# Patient Record
Sex: Male | Born: 1985 | Race: White | Hispanic: No | Marital: Single | Smoking: Current every day smoker
Health system: Southern US, Community
[De-identification: ages and names within clinical notes are randomized; demographics above are authoritative.]

## PROBLEM LIST (undated history)

## (undated) DIAGNOSIS — H1789 Other corneal scars and opacities: Secondary | ICD-10-CM

---

## 2005-04-27 ENCOUNTER — Emergency Department (HOSPITAL_COMMUNITY): Admission: EM | Admit: 2005-04-27 | Discharge: 2005-04-27 | Payer: Self-pay | Admitting: Emergency Medicine

## 2005-04-28 IMAGING — CR DG SHOULDER 2+V*L*
3 series · 3 of 3 positions shown · non-contrast
Comparison: none

CLINICAL DATA: Injury. 
 LEFT SHOULDER ? 3 VIEW ([DATE] HOURS):
 There is no evidence of fracture or dislocation.  There is no evidence of arthropathy or other focal bone abnormality.  Soft tissues are unremarkable.

[t shoulder ap internal left]
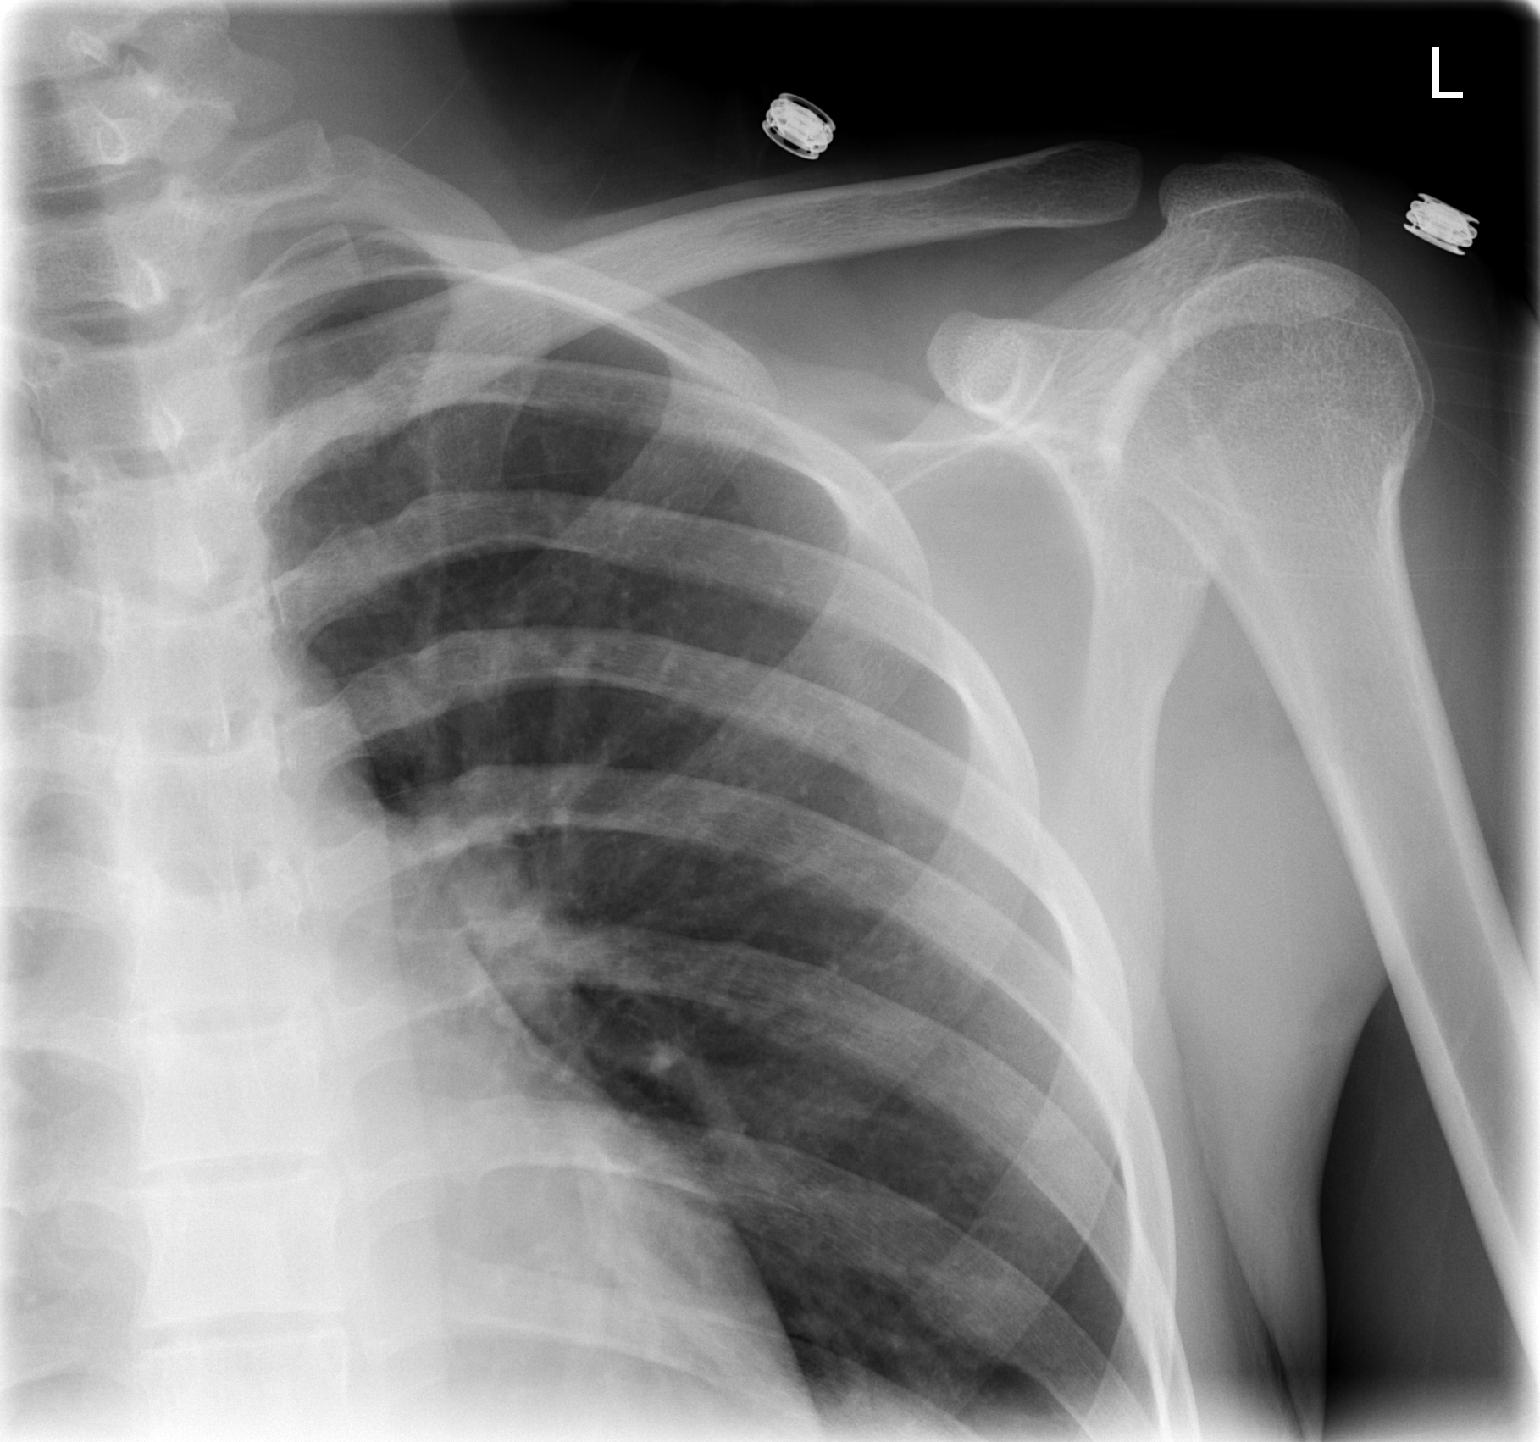

[t shoulder ap external left]
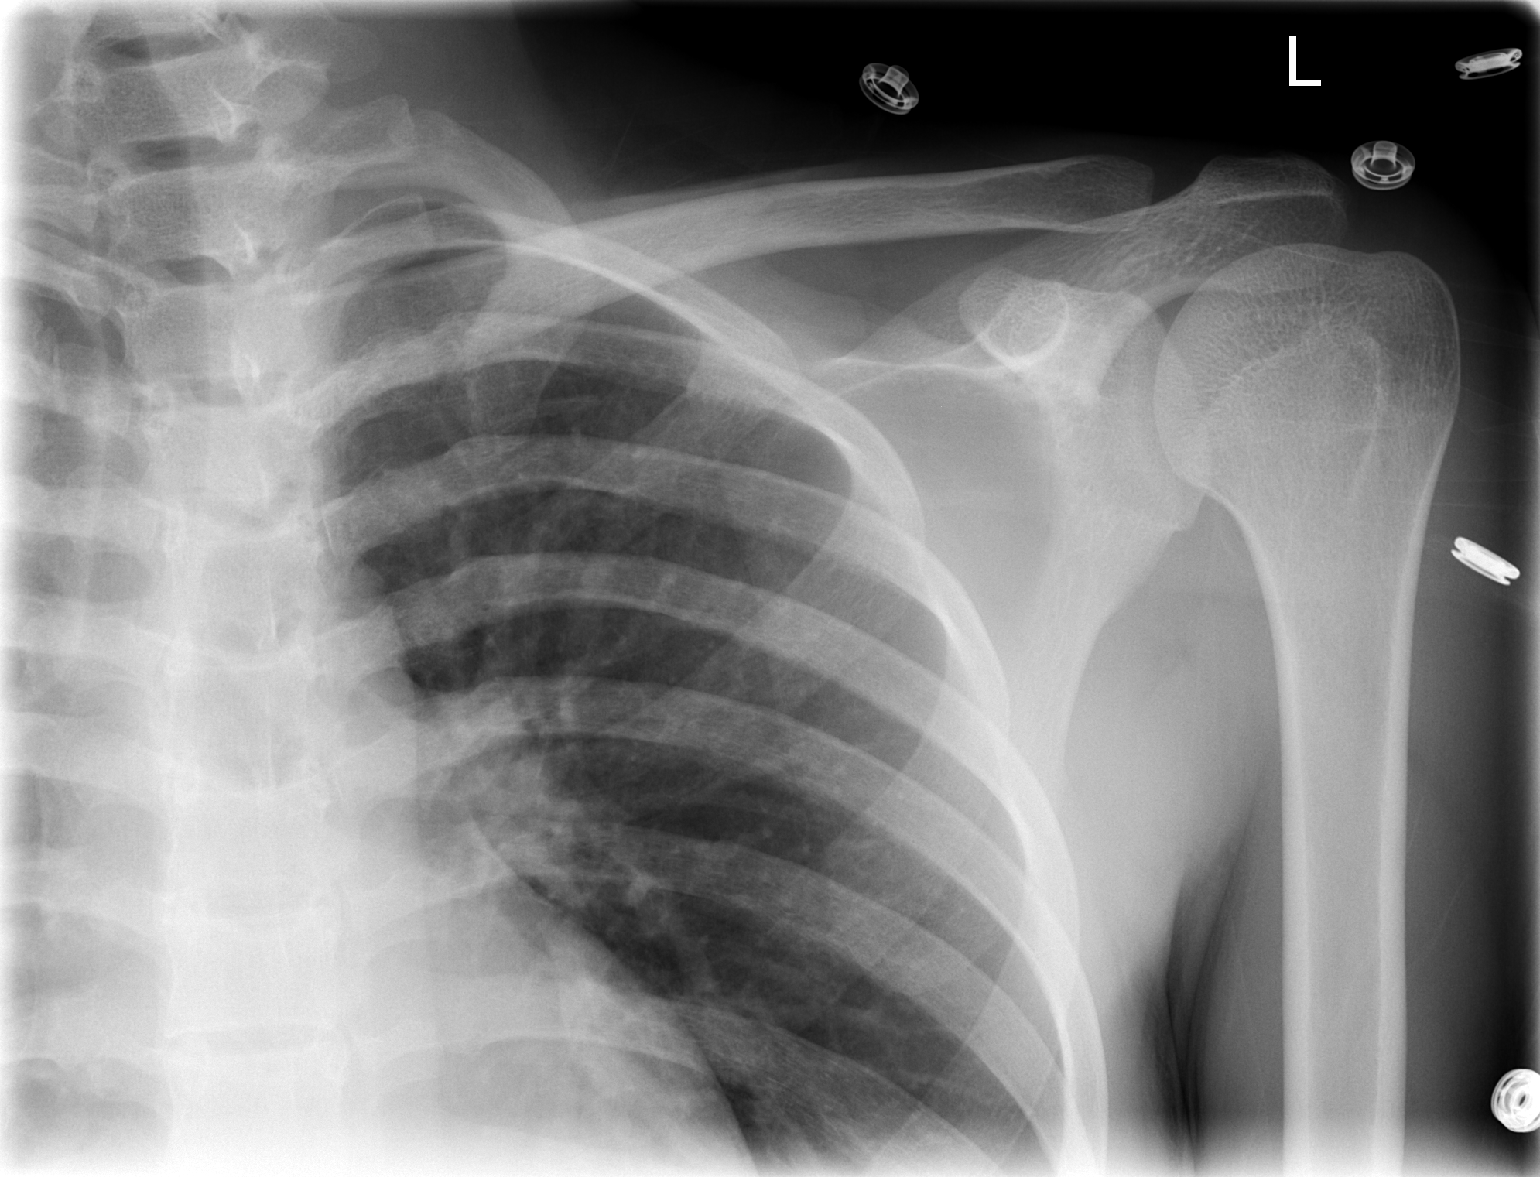

[t shoulder y view left]
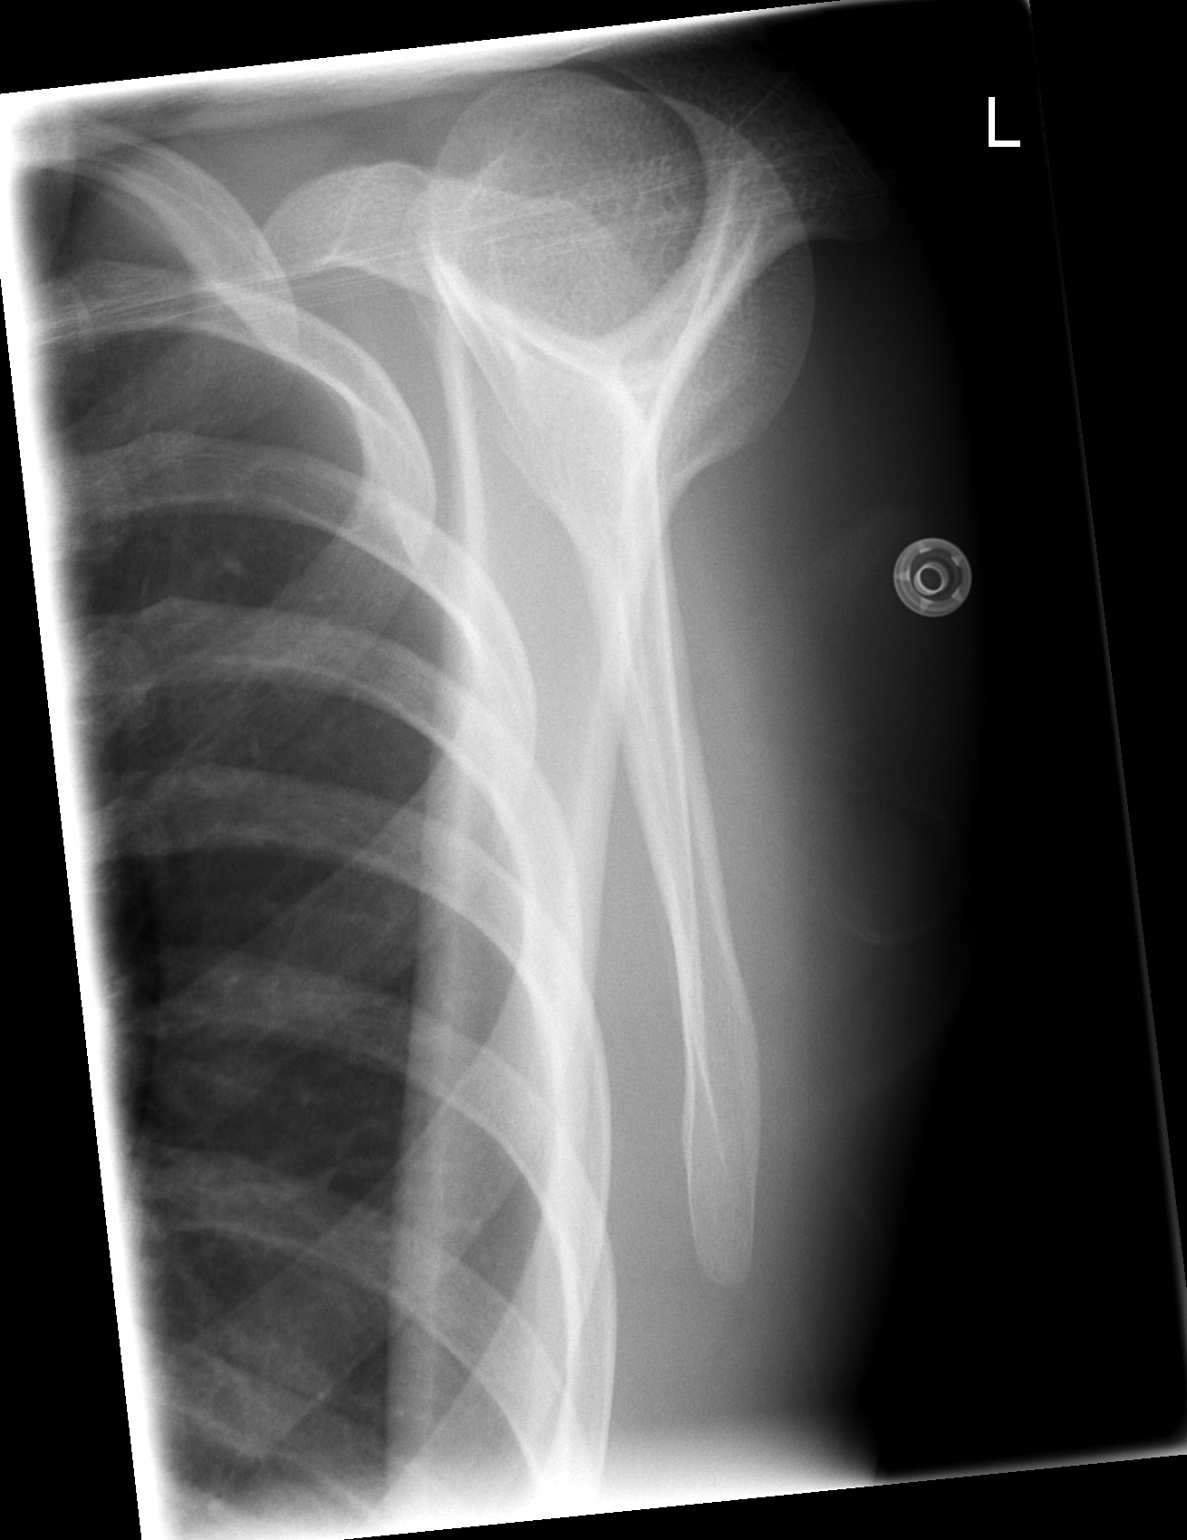

[3 of 3 positions shown; findings below may reference images not displayed]

IMPRESSION: Negative.

## 2006-08-10 ENCOUNTER — Emergency Department (HOSPITAL_COMMUNITY): Admission: EM | Admit: 2006-08-10 | Discharge: 2006-08-10 | Payer: Self-pay | Admitting: Emergency Medicine

## 2006-10-10 ENCOUNTER — Emergency Department (HOSPITAL_COMMUNITY): Admission: EM | Admit: 2006-10-10 | Discharge: 2006-10-10 | Payer: Self-pay | Admitting: Emergency Medicine

## 2014-03-13 ENCOUNTER — Encounter (HOSPITAL_COMMUNITY): Payer: Self-pay | Admitting: Emergency Medicine

## 2014-03-13 ENCOUNTER — Emergency Department (HOSPITAL_COMMUNITY)
Admission: EM | Admit: 2014-03-13 | Discharge: 2014-03-13 | Disposition: A | Discharge status: Home / Self Care | Payer: Self-pay | Attending: Emergency Medicine | Admitting: Emergency Medicine

## 2014-03-13 DIAGNOSIS — R42 Dizziness and giddiness: Secondary | ICD-10-CM | POA: Insufficient documentation

## 2014-03-13 DIAGNOSIS — R112 Nausea with vomiting, unspecified: Secondary | ICD-10-CM | POA: Insufficient documentation

## 2014-03-13 DIAGNOSIS — J36 Peritonsillar abscess: Secondary | ICD-10-CM | POA: Insufficient documentation

## 2014-03-13 DIAGNOSIS — F17200 Nicotine dependence, unspecified, uncomplicated: Secondary | ICD-10-CM | POA: Insufficient documentation

## 2014-03-13 LAB — RAPID STREP SCREEN (MED CTR MEBANE ONLY): STREPTOCOCCUS, GROUP A SCREEN (DIRECT): NEGATIVE

## 2014-03-13 MED ORDER — CLINDAMYCIN PHOSPHATE 600 MG/50ML IV SOLN
600.0000 mg | Freq: Once | INTRAVENOUS | Status: AC
  Administered 2014-03-13: 600 mg via INTRAVENOUS
  Filled 2014-03-13: qty 50

## 2014-03-13 MED ORDER — DEXAMETHASONE SODIUM PHOSPHATE 10 MG/ML IJ SOLN
10.0000 mg | Freq: Once | INTRAMUSCULAR | Status: AC
  Administered 2014-03-13: 10 mg via INTRAVENOUS
  Filled 2014-03-13: qty 1

## 2014-03-13 MED ORDER — OXYCODONE-ACETAMINOPHEN 5-325 MG PO TABS: 2.0000 | ORAL_TABLET | ORAL | Status: DC | PRN

## 2014-03-13 MED ORDER — SODIUM CHLORIDE 0.9 % IV BOLUS (SEPSIS)
1000.0000 mL | Freq: Once | INTRAVENOUS | Status: AC
  Administered 2014-03-13: 1000 mL via INTRAVENOUS

## 2014-03-13 MED ORDER — HYDROCODONE-ACETAMINOPHEN 7.5-325 MG/15ML PO SOLN
10.0000 mL | Freq: Once | ORAL | Status: AC
  Administered 2014-03-13: 10 mL via ORAL
  Filled 2014-03-13: qty 15

## 2014-03-13 MED ORDER — CLINDAMYCIN HCL 300 MG PO CAPS
300.0000 mg | ORAL_CAPSULE | Freq: Once | ORAL | Status: AC
  Administered 2014-03-13: 300 mg via ORAL
  Filled 2014-03-13: qty 1

## 2014-03-13 MED ORDER — ONDANSETRON HCL 4 MG/2ML IJ SOLN
4.0000 mg | Freq: Once | INTRAMUSCULAR | Status: AC
  Administered 2014-03-13: 4 mg via INTRAVENOUS
  Filled 2014-03-13: qty 2

## 2014-03-13 MED ORDER — CLINDAMYCIN HCL 150 MG PO CAPS: 300.0000 mg | ORAL_CAPSULE | Freq: Four times a day (QID) | ORAL | Status: DC

## 2014-03-13 MED ORDER — IBUPROFEN 800 MG PO TABS: 800.0000 mg | ORAL_TABLET | Freq: Three times a day (TID) | ORAL | Status: DC

## 2014-03-13 NOTE — ED Notes (Signed)
Patient found vomiting light brown emesis. Vomiting before abx started. MD aware. Zofran 4 mg ordered and 1L NS. Given immediately. Patient reports he does not vomit after pain medicine and states "I take stronger pain medicine and I never throw up." Patient resting.

## 2014-03-13 NOTE — ED Provider Notes (Signed)
CSN: [id number]     Arrival date & time 03/13/14  1125 History  This chart was scribed for non-physician practitioner Jaynie Crumbleatyana Esmeralda Malay, PA-C working with Gilda Creasehristopher J. Pollina, MD by Dorothey Basemania Sutton, ED Scribe. This patient was seen in room WTR7/WTR7 and the patient's care was started at 12:38 PM.    Chief Complaint  Patient presents with  . Sore Throat  . Fever  . Nausea  . Emesis   The history is provided by the patient. No language interpreter was used.   HPI Comments: Taylor Liu is a 28 y.o. male who presents to the Emergency Department complaining of a constant pain to the right side of the throat onset 6 days ago that he states has been progressively worsening, 7/10 currently. He states that the pain is exacerbated with opening his mouth and swallowing. Patient reports taking "Airborne" and using lozenges at home without significant relief. He reports associated nausea with a few episodes of non-bilious, non-bloody emesis onset yesterday (two episodes today PTA). Patient also reports associated, subjective fever (patient is afebrile at 98.6 in the ED) with chills and lightheadedness onset yesterday. He denies chest pain, shortness of breath. Patient has allergies to Sulfa antibiotics. Patient has no other pertinent medical history.   History reviewed. No pertinent past medical history. History reviewed. No pertinent past surgical history. No family history on file. History  Substance Use Topics  . Smoking status: Current Every Day Smoker  . Smokeless tobacco: Not on file  . Alcohol Use: No    Review of Systems  Constitutional: Positive for fever (subjective, resolved ) and chills.  HENT: Positive for sore throat.   Respiratory: Negative for shortness of breath.   Cardiovascular: Negative for chest pain.  Gastrointestinal: Positive for nausea and vomiting.  Neurological: Positive for light-headedness.    Allergies  Review of patient's allergies indicates not on  file.  Home Medications   Prior to Admission medications   Not on File   Triage Vitals: BP 121/79  Pulse 99  Temp(Src) 98.6 F (37 C) (Oral)  Resp 18  Ht 5\' 5"  (1.651 m)  Wt 127 lb (57.607 kg)  BMI 21.13 kg/m2  SpO2 100%  Physical Exam  Nursing note and vitals reviewed. Constitutional: He is oriented to person, place, and time. He appears well-developed and well-nourished. No distress.  HENT:  Head: Normocephalic and atraumatic.  Mouth/Throat: There is trismus in the jaw. Posterior oropharyngeal erythema present.  Some trismus. Right tonsil is enlarged with uvula deviation to the left. Pharyngeal erythema.   Eyes: Conjunctivae are normal.  Neck: Normal range of motion. Neck supple.  Cardiovascular: Normal rate, regular rhythm and normal heart sounds.   Pulmonary/Chest: Effort normal and breath sounds normal. No respiratory distress.  Abdominal: He exhibits no distension.  Musculoskeletal: Normal range of motion.  Neurological: He is alert and oriented to person, place, and time.  Skin: Skin is warm and dry.  Psychiatric: He has a normal mood and affect. His behavior is normal.    ED Course  Procedures (including critical care time)  DIAGNOSTIC STUDIES: Oxygen Saturation is 100% on room air, normal by my interpretation.    COORDINATION OF CARE: 11:40 AM- Ordered a rapid strep screen.   12:41 PM- Discussed that strep test results were negative. Discussed that symptoms appear to be due to a peritonsillar abscess. Will order Decadron, Cleocin, and Hycet to manage symptoms. Will consult with ENT. Advised patient to follow up with the referred ENT specialist for further evaluation. Discussed  treatment plan with patient at bedside and patient verbalized agreement.    Labs Review Labs Reviewed  RAPID STREP SCREEN  CULTURE, GROUP A STREP    Imaging Review No results found.   EKG Interpretation None      MDM   Final diagnoses:  Peritonsillar abscess     Patient's exam consistent with right peritonsillar abscess. He is able to swallow his secretions. He is nontoxic appearing. His vital signs are normal. I initially ordered him 10 of Decadron IM, 300 mg of clindamycin by mouth. I did speak with Dr. Suszanne Connerseoh with ear nose throat, who recommended getting total of 900 mg of clindamycin and and 20 mL of Decadron IV. I have also given patient some fluids call person for pain. He states his pain has improved. I this time we'll discharge home with clindamycin, Percocet for pain, ibuprofen. Followup provided with Dr. Suszanne Connerseoh. Return if symptoms are worsening.   Filed Vitals:   03/13/14 1137  BP: 121/79  Pulse: 99  Temp: 98.6 F (37 C)  TempSrc: Oral  Resp: 18  Height: 5\' 5"  (1.651 m)  Weight: 127 lb (57.607 kg)  SpO2: 100%   I personally performed the services described in this documentation, which was scribed in my presence. The recorded information has been reviewed and is accurate.      Lottie Musselatyana A Tanyiah Laurich, PA-C 03/13/14 1507

## 2014-03-13 NOTE — ED Provider Notes (Signed)
Medical screening examination/treatment/procedure(s) were conducted as a shared visit with non-physician practitioner(s) and myself.  I personally evaluated the patient during the encounter.   EKG Interpretation None     Patient presents to the ER for evaluation of sore throat for 5 days. He has been having increasing difficulties swallowing and opening his mouth. Examination reveals unilateral swelling on the right side, suspicious for early peritonsillar abscess or peritonsillar cellulitis. The patient initiated on parenteral antibiotics and steroids, will followup with ENT. Return to the ER for worsening symptoms.  Gilda Creasehristopher J. Pollina, MD 03/13/14 [id number]

## 2014-03-13 NOTE — ED Notes (Signed)
Patient resting comfortably. Per patient nausea has resolved and pain is gradually improving. A&Ox4.

## 2014-03-13 NOTE — Discharge Instructions (Signed)
Ibuprofen for pain. Percocet for severe pain. Clindamycin for infection until all gone. Follow up with Dr. Christain Sacramentoeo this week. Return if symptoms worsening and unable to follow up.    Peritonsillar Abscess A peritonsillar abscess is a collection of pus located in the back of the throat behind the tonsils. It usually occurs when a streptococcal infection of the throat or tonsils spreads into the space around the tonsils. They are almost always caused by the streptococcal germ (bacteria). The treatment of a peritonsillar abscess is most often drainage accomplished by putting a needle into the abscess or cutting (incising) and draining the abscess. This is most often followed with a course of antibiotics. HOME CARE INSTRUCTIONS  If your abscess was drained by your caregiver today, rinse your throat (gargle) with warm salt water four times per day or as needed for comfort. Do not swallow this mixture. Mix 1 teaspoon of salt in 8 ounces of warm water for gargling.  Rest in bed as needed. Resume activities as able.  Apply cold to your neck for pain relief. Fill a plastic bag with ice and wrap it in a towel. Hold the ice on your neck for 20 minutes 4 times per day.  Eat a soft or liquid diet as tolerated while your throat remains sore. Popsicles and ice cream may be good early choices. Drinking plenty of cold fluids will probably be soothing and help take swelling down in between the warm gargles.  Only take over-the-counter or prescription medicines for pain, discomfort, or fever as directed by your caregiver. Do not use aspirin unless directed by your physician. Aspirin slows down the clotting process. It can also cause bleeding from the drainage area if this was needled or incised today.  If antibiotics were prescribed, take them as directed for the full course of the prescription. Even if you feel you are well, you need to take them. SEEK MEDICAL CARE IF:   You have increased pain, swelling, redness, or  drainage in your throat.  You develop signs of infection such as dizziness, headache, lethargy, or generalized feelings of illness.  You have difficulty breathing, swallowing or eating.  You show signs of becoming dehydrated (lightheadedness when standing, decreased urine output, a fast heart rate, or dry mouth and mucous membranes). SEEK IMMEDIATE MEDICAL CARE IF:   You have a fever.  You are coughing up or vomiting blood.  You develop more severe throat pain uncontrolled with medicines or you start to drool.  You develop difficulty breathing, talking, or find it easier to breathe while leaning forward. Document Released: 11/16/2005 Document Revised: 02/08/2012 Document Reviewed: 06/29/2008 [facility] Patient Information 2014 [place]Care, [place].

## 2014-03-13 NOTE — ED Notes (Signed)
Pt presents with NAD. Pt c/o of sore throat since Thursday. N/V start today. Fever started yesterday controlled by tylenol.

## 2014-03-13 NOTE — Progress Notes (Signed)
   CARE MANAGEMENT ED NOTE 03/13/2014  Patient:  Farrel GordonKINGSBURY,Dalan   Account Number:  [id number]  Date Initiated:  03/13/2014  Documentation initiated by:  Radford PaxFERRERO,Bryen Hinderman  Subjective/Objective Assessment:   Patient presents to Ed with sore throat n/v fever.     Subjective/Objective Assessment Detail:     Action/Plan:   Action/Plan Detail:   Anticipated DC Date:  03/13/2014     Status Recommendation to Physician:   Result of Recommendation:    Other ED Services  Consult Working Plan    DC Planning Services  Other  PCP issues    Choice offered to / List presented to:            Status of service:  Completed, signed off  ED Comments:   ED Comments Detail:  EDCM spke to patient at bedside.  As per patient, [place] to be discharged on Clinadmycin 300mg .  EDCM provided patient discount coupon for Clindamycin from [employer] https://figueroa.info/[email or web address] and RX discount card.  EDCM also provided patient with a list of pcps who accept self pay patients, phone number to inquire about Affordable Care Act, Medicaid and orange card, list of financial resources such as local churches and [employer], [employer] ministries and Secretary/administratordental insurance for uninsured patients.  Patient thankful for resources.  No further EDCM needs at this time.

## 2014-03-15 LAB — CULTURE, GROUP A STREP

## 2014-04-03 ENCOUNTER — Encounter (HOSPITAL_COMMUNITY): Payer: Self-pay | Admitting: Emergency Medicine

## 2014-04-03 ENCOUNTER — Emergency Department (HOSPITAL_COMMUNITY)
Admission: EM | Admit: 2014-04-03 | Discharge: 2014-04-03 | Disposition: A | Discharge status: Home / Self Care | Payer: Self-pay | Attending: Emergency Medicine | Admitting: Emergency Medicine

## 2014-04-03 DIAGNOSIS — F17200 Nicotine dependence, unspecified, uncomplicated: Secondary | ICD-10-CM | POA: Insufficient documentation

## 2014-04-03 DIAGNOSIS — Z79899 Other long term (current) drug therapy: Secondary | ICD-10-CM | POA: Insufficient documentation

## 2014-04-03 DIAGNOSIS — J020 Streptococcal pharyngitis: Secondary | ICD-10-CM | POA: Insufficient documentation

## 2014-04-03 DIAGNOSIS — Z792 Long term (current) use of antibiotics: Secondary | ICD-10-CM | POA: Insufficient documentation

## 2014-04-03 LAB — RAPID STREP SCREEN (MED CTR MEBANE ONLY): STREPTOCOCCUS, GROUP A SCREEN (DIRECT): POSITIVE — AB

## 2014-04-03 MED ORDER — IBUPROFEN 800 MG PO TABS
800.0000 mg | ORAL_TABLET | Freq: Once | ORAL | Status: AC
  Administered 2014-04-03: 800 mg via ORAL
  Filled 2014-04-03: qty 1

## 2014-04-03 MED ORDER — HYDROCODONE-ACETAMINOPHEN 5-325 MG PO TABS
2.0000 | ORAL_TABLET | Freq: Once | ORAL | Status: AC
  Administered 2014-04-03: 2 via ORAL
  Filled 2014-04-03: qty 2

## 2014-04-03 MED ORDER — PENICILLIN G BENZATHINE 1200000 UNIT/2ML IM SUSP
1.2000 10*6.[IU] | Freq: Once | INTRAMUSCULAR | Status: AC
  Administered 2014-04-03: 1.2 10*6.[IU] via INTRAMUSCULAR
  Filled 2014-04-03: qty 2

## 2014-04-03 MED ORDER — HYDROCODONE-ACETAMINOPHEN 5-325 MG PO TABS: 1.0000 | ORAL_TABLET | ORAL | Status: DC | PRN

## 2014-04-03 MED ORDER — IBUPROFEN 800 MG PO TABS: 800.0000 mg | ORAL_TABLET | Freq: Three times a day (TID) | ORAL | Status: DC | PRN

## 2014-04-03 NOTE — ED Notes (Signed)
Pt c/o tonsil swelling yesterday; worse since yesterday; previous history of same a few wks ago

## 2014-04-03 NOTE — ED Provider Notes (Signed)
TIME SEEN: 9:14 AM  CHIEF COMPLAINT: Sore throat, fevers and chills  HPI: Patient is a 28 year old male with no significant past medical history presents emergency department resource register yesterday. He is also had subjective fevers and chills. He has any difficulty swallowing, speaking or breathing. No history of injury. No vomiting or diarrhea. No rash. No sick contacts. He was seen here approximately 2 weeks ago and diagnosed with a clinical. Tonsillar abscess. History of chest at that time was negative. He was sent home on clindamycin and scheduled to see Dr. Suszanne Connerseoh ENT as an outpatient. He states he finished his clindamycin but never followed up with ENT. He reports that he was feeling much better and his tonsillar pain and swelling have resolved.  ROS: See HPI Constitutional: Subjective fever  Eyes: no drainage  ENT: no runny nose   Cardiovascular:  no chest pain  Resp: no SOB  GI: no vomiting GU: no dysuria Integumentary: no rash  Allergy: no hives  Musculoskeletal: no leg swelling  Neurological: no slurred speech ROS otherwise negative  PAST MEDICAL HISTORY/PAST SURGICAL HISTORY:  History reviewed. No pertinent past medical history.  MEDICATIONS:  Prior to Admission medications   Medication Sig Start Date End Date Taking? Authorizing Provider  acetaminophen (TYLENOL) 500 MG tablet Take 1,000 mg by mouth every 6 (six) hours as needed for moderate pain.    Historical Provider, MD  clindamycin (CLEOCIN) 150 MG capsule Take 2 capsules (300 mg total) by mouth 4 (four) times daily. 03/13/14   Tatyana A Kirichenko, PA-C  ibuprofen (ADVIL,MOTRIN) 800 MG tablet Take 800 mg by mouth every 8 (eight) hours as needed for moderate pain.    Historical Provider, MD  ibuprofen (ADVIL,MOTRIN) 800 MG tablet Take 1 tablet (800 mg total) by mouth 3 (three) times daily. 03/13/14   Tatyana A Kirichenko, PA-C  METHADONE HCL PO Take 80 mg by mouth daily.    Historical Provider, MD   oxyCODONE-acetaminophen (PERCOCET/ROXICET) 5-325 MG per tablet Take 2 tablets by mouth every 4 (four) hours as needed for severe pain. 03/13/14   Tatyana A Kirichenko, PA-C    ALLERGIES:  Allergies  Allergen Reactions  . Sulfa Antibiotics Other (See Comments)    Hands swell and itchy    SOCIAL HISTORY:  History  Substance Use Topics  . Smoking status: Current Every Day Smoker  . Smokeless tobacco: Not on file  . Alcohol Use: No    FAMILY HISTORY: No family history on file.  EXAM: BP 116/75  Pulse 99  Temp(Src) 99.6 F (37.6 C) (Oral)  Resp 16  SpO2 100% CONSTITUTIONAL: Alert and oriented and responds appropriately to questions. Well-appearing; well-nourished, appears nontoxic and in no apparent distress HEAD: Normocephalic EYES: Conjunctivae clear, PERRL ENT: normal nose; no rhinorrhea; moist mucous membranes; patient has bilateral tonsillar hypertrophy with mild exudate, no unilateral hypertrophy, no uvular deviation, no trismus or drooling, no muffled voice, no dental caries NECK: Supple, no meningismus, no LAD; no pain with movement of his neck CARD: RRR; S1 and S2 appreciated; no murmurs, no clicks, no rubs, no gallops RESP: Normal chest excursion without splinting or tachypnea; breath sounds clear and equal bilaterally; no wheezes, no rhonchi, no rales,  ABD/GI: Normal bowel sounds; non-distended; soft, non-tender, no rebound, no guarding BACK:  The back appears normal and is non-tender to palpation, there is no CVA tenderness EXT: Normal ROM in all joints; non-tender to palpation; no edema; normal capillary refill; no cyanosis    SKIN: Normal color for age and  race; warm NEURO: Moves all extremities equally PSYCH: The patient's mood and manner are appropriate. Grooming and personal hygiene are appropriate.  MEDICAL DECISION MAKING: Patient here with strep pharyngitis. Strep test is negative. No sign of peritonsillar abscess, retropharyngeal abscess, meningitis on  exam. He is otherwise well-appearing, hemodynamically stable, nontoxic. We'll give penicillin IM and pain medication. Have discussed strict return precautions and supportive care instructions. Patient verbalizes understanding  And is comfortable with this plan.      Layla MawKristen N Tekeisha Hakim, DO 04/03/14 [phone]

## 2014-04-03 NOTE — Discharge Instructions (Signed)
Strep Throat  Strep throat is an infection of the throat caused by a bacteria named Streptococcus pyogenes. Your caregiver may call the infection streptococcal "tonsillitis" or "pharyngitis" depending on whether there are signs of inflammation in the tonsils or back of the throat. Strep throat is most common in children aged 28 15 years during the cold months of the year, but it can occur in people of any age during any season. This infection is spread from person to person (contagious) through coughing, sneezing, or other close contact.  SYMPTOMS   · Fever or chills.  · Painful, swollen, red tonsils or throat.  · Pain or difficulty when swallowing.  · White or yellow spots on the tonsils or throat.  · Swollen, tender lymph nodes or "glands" of the neck or under the jaw.  · Red rash all over the body (rare).  DIAGNOSIS   Many different infections can cause the same symptoms. A test must be done to confirm the diagnosis so the right treatment can be given. A "rapid strep test" can help your caregiver make the diagnosis in a few minutes. If this test is not available, a light swab of the infected area can be used for a throat culture test. If a throat culture test is done, results are usually available in a day or two.  TREATMENT   Strep throat is treated with antibiotic medicine.  HOME CARE INSTRUCTIONS   · Gargle with 1 tsp of salt in 1 cup of warm water, 3 4 times per day or as needed for comfort.  · Family members who also have a sore throat or fever should be tested for strep throat and treated with antibiotics if they have the strep infection.  · Make sure everyone in your household washes their hands well.  · Do not share food, drinking cups, or personal items that could cause the infection to spread to others.  · You may need to eat a soft food diet until your sore throat gets better.  · Drink enough water and fluids to keep your urine clear or pale yellow. This will help prevent dehydration.  · Get plenty of  rest.  · Stay home from school, daycare, or work until you have been on antibiotics for 24 hours.  · Only take over-the-counter or prescription medicines for pain, discomfort, or fever as directed by your caregiver.  · If antibiotics are prescribed, take them as directed. Finish them even if you start to feel better.  SEEK MEDICAL CARE IF:   · The glands in your neck continue to enlarge.  · You develop a rash, cough, or earache.  · You cough up green, yellow-brown, or bloody sputum.  · You have pain or discomfort not controlled by medicines.  · Your problems seem to be getting worse rather than better.  SEEK IMMEDIATE MEDICAL CARE IF:   · You develop any new symptoms such as vomiting, severe headache, stiff or painful neck, chest pain, shortness of breath, or trouble swallowing.  · You develop severe throat pain, drooling, or changes in your voice.  · You develop swelling of the neck, or the skin on the neck becomes red and tender.  · You have a fever.  · You develop signs of dehydration, such as fatigue, dry mouth, and decreased urination.  · You become increasingly sleepy, or you cannot wake up completely.  Document Released: 11/13/2000 Document Revised: 11/02/2012 Document Reviewed: 01/15/2011  ExitCare® Patient Information ©2014 [employer], [employer].

## 2015-02-06 ENCOUNTER — Emergency Department (HOSPITAL_COMMUNITY)
Admission: EM | Admit: 2015-02-06 | Discharge: 2015-02-06 | Disposition: A | Discharge status: Home / Self Care | Payer: Self-pay | Attending: Emergency Medicine | Admitting: Emergency Medicine

## 2015-02-06 ENCOUNTER — Encounter (HOSPITAL_COMMUNITY): Payer: Self-pay

## 2015-02-06 DIAGNOSIS — Z720 Tobacco use: Secondary | ICD-10-CM | POA: Insufficient documentation

## 2015-02-06 DIAGNOSIS — J020 Streptococcal pharyngitis: Secondary | ICD-10-CM | POA: Insufficient documentation

## 2015-02-06 LAB — RAPID STREP SCREEN (MED CTR MEBANE ONLY): STREPTOCOCCUS, GROUP A SCREEN (DIRECT): POSITIVE — AB

## 2015-02-06 MED ORDER — DICLOFENAC SODIUM 75 MG PO TBEC: 75.0000 mg | DELAYED_RELEASE_TABLET | Freq: Two times a day (BID) | ORAL | Status: DC

## 2015-02-06 MED ORDER — PENICILLIN G BENZATHINE 1200000 UNIT/2ML IM SUSP
1.2000 10*6.[IU] | Freq: Once | INTRAMUSCULAR | Status: AC
  Administered 2015-02-06: 1.2 10*6.[IU] via INTRAMUSCULAR
  Filled 2015-02-06: qty 2

## 2015-02-06 MED ORDER — HYDROCODONE-ACETAMINOPHEN 5-325 MG PO TABS: 1.0000 | ORAL_TABLET | ORAL | Status: DC | PRN

## 2015-02-06 MED ORDER — KETOROLAC TROMETHAMINE 10 MG PO TABS
10.0000 mg | ORAL_TABLET | Freq: Once | ORAL | Status: AC
  Administered 2015-02-06: 10 mg via ORAL

## 2015-02-06 NOTE — ED Notes (Signed)
C/o sore throat since Monday evening.

## 2015-02-06 NOTE — ED Provider Notes (Signed)
CSN: [id number]     Arrival date & time 02/06/15  1126 History   First MD Initiated Contact with Patient 02/06/15 1351     Chief Complaint  Patient presents with  . Sore Throat     (Consider location/radiation/quality/duration/timing/severity/associated sxs/prior Treatment) Patient is a 29 y.o. male presenting with pharyngitis. The history is provided by the patient.  Sore Throat This is a new problem. The current episode started in the past 7 days. The problem occurs intermittently. The problem has been gradually worsening. Associated symptoms include congestion, headaches, myalgias and a sore throat. Pertinent negatives include no abdominal pain, arthralgias, chest pain, coughing, fever, neck pain, rash or vomiting. The symptoms are aggravated by swallowing. He has tried NSAIDs for the symptoms. The treatment provided mild relief.    History reviewed. No pertinent past medical history. History reviewed. No pertinent past surgical history. No family history on file. History  Substance Use Topics  . Smoking status: Current Every Day Smoker  . Smokeless tobacco: Not on file  . Alcohol Use: No    Review of Systems  Constitutional: Negative for fever and activity change.       All ROS Neg except as noted in HPI  HENT: Positive for congestion and sore throat. Negative for nosebleeds.   Eyes: Negative for photophobia and discharge.  Respiratory: Negative for cough, shortness of breath and wheezing.   Cardiovascular: Negative for chest pain and palpitations.  Gastrointestinal: Negative for vomiting, abdominal pain and blood in stool.  Genitourinary: Negative for dysuria, frequency and hematuria.  Musculoskeletal: Positive for myalgias. Negative for back pain, arthralgias and neck pain.  Skin: Negative.  Negative for rash.  Neurological: Positive for headaches. Negative for dizziness, seizures and speech difficulty.  Psychiatric/Behavioral: Negative for hallucinations and confusion.       Allergies  Sulfa antibiotics  Home Medications   Prior to Admission medications   Medication Sig Start Date End Date Taking? Authorizing Provider  ibuprofen (ADVIL,MOTRIN) 200 MG tablet Take 200 mg by mouth every 6 (six) hours as needed for moderate pain.   Yes Historical Provider, MD  Pseudoeph-Doxylamine-DM-APAP (DAYQUIL/NYQUIL COLD/FLU RELIEF PO) Take 2 capsules by mouth every 4 (four) hours as needed (sinus/congestion).   Yes Historical Provider, MD  HYDROcodone-acetaminophen (NORCO/VICODIN) 5-325 MG per tablet Take 1 tablet by mouth every 4 (four) hours as needed. Patient not taking: Reported on 02/06/2015 04/03/14   Layla Maw Ward, DO  ibuprofen (ADVIL,MOTRIN) 800 MG tablet Take 1 tablet (800 mg total) by mouth every 8 (eight) hours as needed for mild pain. Patient not taking: Reported on 02/06/2015 04/03/14   Kristen N Ward, DO   BP 139/95 mmHg  Pulse 107  Temp(Src) 99.7 F (37.6 C) (Oral)  Resp 18  Ht  (1.651 m)  Wt 130 lb (58.968 kg)  BMI 21.63 kg/m2  SpO2 99% Physical Exam  Constitutional: He is oriented to person, place, and time. He appears well-developed and well-nourished.  Non-toxic appearance.  HENT:  Head: Normocephalic.  Right Ear: Tympanic membrane and external ear normal.  Left Ear: Tympanic membrane and external ear normal.  Mouth/Throat: Uvula is midline. No trismus in the jaw. Uvula swelling present. Posterior oropharyngeal erythema present. No tonsillar abscesses.  Eyes: EOM and lids are normal. Pupils are equal, round, and reactive to light.  Neck: Normal range of motion. Neck supple. Carotid bruit is not present.  Cardiovascular: Normal rate, regular rhythm, normal heart sounds, intact distal pulses and normal pulses.   Pulmonary/Chest: Breath sounds normal.  No respiratory distress.  Abdominal: Soft. Bowel sounds are normal. There is no tenderness. There is no guarding.  Musculoskeletal: Normal range of motion.  Lymphadenopathy:       Head (right  side): No submandibular adenopathy present.       Head (left side): No submandibular adenopathy present.    He has no cervical adenopathy.  Neurological: He is alert and oriented to person, place, and time. He has normal strength. No cranial nerve deficit or sensory deficit.  Skin: Skin is warm and dry.  Psychiatric: He has a normal mood and affect. His speech is normal.  Nursing note and vitals reviewed.   ED Course  Procedures (including critical care time) Labs Review Labs Reviewed  RAPID STREP SCREEN - Abnormal; Notable for the following:    Streptococcus, Group A Screen (Direct) POSITIVE (*)    All other components within normal limits    Imaging Review No results found.   EKG Interpretation None      MDM  Strep test POS. No rash No abd pain. Pt treated with bicillin. Rx for diclofenac and norco given to the patient.   Final diagnoses:  None    **I have reviewed nursing notes, vital signs, and all appropriate lab and imaging results for this patient.Ivery Quale*    Maddax Palinkas, PA-C 02/08/15 2007  Bethann BerkshireJoseph Zammit, MD 02/12/15 [phone]

## 2015-02-06 NOTE — Discharge Instructions (Signed)
Your strep test is positive for strep. Please wash hands frequently. Please use a mask over the next 3-5 days. Saltwater gargles maybe helpful. You were treated in the emergency department with an injection of Bicillin. Please use diclofenac 2 times daily with food for inflammation, fever and aching. Please use Norco for more severe pain. Norco may cause drowsiness, please do not drive, drink alcohol, up rate machines, or pertussis pain and activities that require concentration taking this medication. Strep Throat Strep throat is an infection of the throat. It is caused by a germ. Strep throat spreads from person to person by coughing, sneezing, or close contact. HOME CARE  Rinse your mouth (gargle) with warm salt water (1 teaspoon salt in 1 cup of water). Do this 3 to 4 times per day or as needed for comfort.  Family members with a sore throat or fever should see a doctor.  Make sure everyone in your house washes their hands well.  Do not share food, drinking cups, or personal items.  Eat soft foods until your sore throat gets better.  Drink enough water and fluids to keep your pee (urine) clear or pale yellow.  Rest.  Stay home from school, daycare, or work until you have taken medicine for 24 hours.  Only take medicine as told by your doctor.  Take your medicine as told. Finish it even if you start to feel better. GET HELP RIGHT AWAY IF:   You have new problems, such as throwing up (vomiting) or bad headaches.  You have a stiff or painful neck, chest pain, trouble breathing, or trouble swallowing.  You have very bad throat pain, drooling, or changes in your voice.  Your neck puffs up (swells) or gets red and tender.  You have a fever.  You are very tired, your mouth is dry, or you are peeing less than normal.  You cannot wake up completely.  You get a rash, cough, or earache.  You have green, yellow-brown, or bloody spit.  Your pain does not get better with  medicine. MAKE SURE YOU:   Understand these instructions.  Will watch your condition.  Will get help right away if you are not doing well or get worse. Document Released: 05/04/2008 Document Revised: 02/08/2012 Document Reviewed: 01/15/2011 [facility] Patient Information 2015 [employer] [place]. This information is not intended to replace advice given to you by your health care provider. Make sure you discuss any questions you have with your health care provider.

## 2018-05-01 ENCOUNTER — Encounter (HOSPITAL_COMMUNITY): Payer: Self-pay | Admitting: Emergency Medicine

## 2018-05-01 ENCOUNTER — Other Ambulatory Visit: Payer: Self-pay

## 2018-05-01 ENCOUNTER — Emergency Department (HOSPITAL_COMMUNITY)
Admission: EM | Admit: 2018-05-01 | Discharge: 2018-05-01 | Disposition: A | Discharge status: Home / Self Care | Payer: Self-pay | Attending: Emergency Medicine | Admitting: Emergency Medicine

## 2018-05-01 DIAGNOSIS — J020 Streptococcal pharyngitis: Secondary | ICD-10-CM | POA: Insufficient documentation

## 2018-05-01 DIAGNOSIS — F17210 Nicotine dependence, cigarettes, uncomplicated: Secondary | ICD-10-CM | POA: Insufficient documentation

## 2018-05-01 LAB — GROUP A STREP BY PCR: Group A Strep by PCR: DETECTED — AB

## 2018-05-01 MED ORDER — PENICILLIN G BENZATHINE 1200000 UNIT/2ML IM SUSP
1.2000 10*6.[IU] | Freq: Once | INTRAMUSCULAR | Status: AC
  Administered 2018-05-01: 1.2 10*6.[IU] via INTRAMUSCULAR
  Filled 2018-05-01: qty 2

## 2018-05-01 MED ORDER — MAGIC MOUTHWASH W/LIDOCAINE: 5.0000 mL | Freq: Three times a day (TID) | ORAL | 0 refills | Status: DC | PRN

## 2018-05-01 NOTE — ED Provider Notes (Signed)
[facility] EMERGENCY DEPARTMENT Provider Note   CSN: [id number] Arrival date & time: 05/01/18  1504     History   Chief Complaint Chief Complaint  Patient presents with  . Sore Throat    HPI Taylor Liu is a 32 y.o. male.  HPI   Taylor Liu is a 32 y.o. male who presents to the Emergency Department complaining of sore throat for 2 days.  He states the pain has been increasing and causing difficulty swallowing foods or fluids.  He has not taken any over-the-counter medications.  He admits to frequent episodes of strep throat.  He denies cough, fever, chills, abdominal pain, vomiting or rash.   History reviewed. No pertinent past medical history.  There are no active problems to display for this patient.   History reviewed. No pertinent surgical history.    Home Medications    Prior to Admission medications   Medication Sig Start Date End Date Taking? Authorizing Provider  diclofenac (VOLTAREN) 75 MG EC tablet Take 1 tablet (75 mg total) by mouth 2 (two) times daily. 02/06/15   Ivery Quale, PA-C  HYDROcodone-acetaminophen (NORCO/VICODIN) 5-325 MG per tablet Take 1 tablet by mouth every 4 (four) hours as needed. 02/06/15   Ivery Quale, PA-C  ibuprofen (ADVIL,MOTRIN) 200 MG tablet Take 200 mg by mouth every 6 (six) hours as needed for moderate pain.    [provider]  ibuprofen (ADVIL,MOTRIN) 800 MG tablet Take 1 tablet (800 mg total) by mouth every 8 (eight) hours as needed for mild pain. Patient not taking: Reported on 02/06/2015 04/03/14   Ward, Layla Maw, DO  Pseudoeph-Doxylamine-DM-APAP (DAYQUIL/NYQUIL COLD/FLU RELIEF PO) Take 2 capsules by mouth every 4 (four) hours as needed (sinus/congestion).    [provider]    Family History History reviewed. No pertinent family history.  Social History Social History   Tobacco Use  . Smoking status: Current Every Day Smoker    Packs/day: 1.00    Types: Cigarettes  . Smokeless tobacco: Never  Used  Substance Use Topics  . Alcohol use: No  . Drug use: Not Currently     Allergies   Sulfa antibiotics   Review of Systems Review of Systems  Constitutional: Negative for activity change, appetite change, chills and fever.  HENT: Positive for sore throat and trouble swallowing. Negative for congestion, ear pain, facial swelling and voice change.   Respiratory: Negative for cough and shortness of breath.   Gastrointestinal: Negative for abdominal pain, nausea and vomiting.  Musculoskeletal: Negative for arthralgias, neck pain and neck stiffness.  Skin: Negative for color change and rash.  Neurological: Negative for dizziness, speech difficulty, numbness and headaches.  Hematological: Negative for adenopathy.  All other systems reviewed and are negative.    Physical Exam Updated Vital Signs BP 124/81 (BP Location: Right Arm)   Pulse (!) 104   Temp 99 F (37.2 C) (Temporal)   Resp 16   Ht 5\' 5"  (1.651 m)   Wt 61.2 kg (135 lb)   SpO2 99%   BMI 22.47 kg/m   Physical Exam  Constitutional: He appears well-developed and well-nourished. He does not appear ill.  HENT:  Mouth/Throat: Uvula is midline and mucous membranes are normal. No uvula swelling. Posterior oropharyngeal edema and posterior oropharyngeal erythema present. No oropharyngeal exudate or tonsillar abscesses.  Neck: Normal range of motion.  Cardiovascular: Normal rate and regular rhythm.  No murmur heard. Pulmonary/Chest: Effort normal and breath sounds normal. No respiratory distress.  Musculoskeletal: Normal range of motion.  Lymphadenopathy:    He has cervical adenopathy.  Neurological: He is alert. No sensory deficit.  Skin: Skin is warm. Capillary refill takes less than 2 seconds. No rash noted.  Nursing note and vitals reviewed.    ED Treatments / Results  Labs (all labs ordered are listed, but only abnormal results are displayed) Labs Reviewed  GROUP A STREP BY PCR - Abnormal; Notable for the  following components:      Result Value   Group A Strep by PCR DETECTED (*)    All other components within normal limits    EKG None  Radiology No results found.  Procedures Procedures (including critical care time)  Medications Ordered in ED Medications - No data to display   Initial Impression / Assessment and Plan / ED Course  I have reviewed the triage vital signs and the nursing notes.  Pertinent labs & imaging results that were available during my care of the patient were reviewed by me and considered in my medical decision making (see chart for details).    Airway patent.  No peritonsillar abscess.  Patient agrees to IM Bicillin.  Prescription written for Magic mouthwash, he agrees to Tylenol or ibuprofen for symptom relief.  Return precautions discussed.   Final Clinical Impressions(s) / ED Diagnoses   Final diagnoses:  Strep pharyngitis    ED Discharge Orders    None       Rosey Bathriplett, Cynai Skeens, PA-C 05/01/18 1604    Raeford RazorKohut, Stephen, MD 05/04/18 1257

## 2018-05-01 NOTE — Discharge Instructions (Signed)
Drink plenty of fluids.  Alternate Tylenol and ibuprofen every 4-6 hours if needed for fever or body aches.  Follow-up with your primary doctor or return to the ER for any worsening symptoms.

## 2018-05-01 NOTE — ED Triage Notes (Signed)
Pt complains of ST x 2 days   No OTC meds  Smokes 1PPD

## 2020-07-13 ENCOUNTER — Encounter (HOSPITAL_COMMUNITY): Payer: Self-pay | Admitting: Emergency Medicine

## 2020-07-13 ENCOUNTER — Emergency Department (HOSPITAL_COMMUNITY): Admission: EM | Admit: 2020-07-13 | Discharge: 2020-07-13 | Discharge status: Against Medical Advice | Payer: Self-pay

## 2020-07-13 ENCOUNTER — Emergency Department (HOSPITAL_COMMUNITY): Payer: No Typology Code available for payment source

## 2020-07-13 ENCOUNTER — Other Ambulatory Visit: Payer: Self-pay

## 2020-07-13 ENCOUNTER — Emergency Department (HOSPITAL_COMMUNITY)
Admission: EM | Admit: 2020-07-13 | Discharge: 2020-07-13 | Disposition: A | Discharge status: Home / Self Care | Payer: No Typology Code available for payment source | Attending: Emergency Medicine | Admitting: Emergency Medicine

## 2020-07-13 DIAGNOSIS — Y9389 Activity, other specified: Secondary | ICD-10-CM | POA: Diagnosis not present

## 2020-07-13 DIAGNOSIS — F17210 Nicotine dependence, cigarettes, uncomplicated: Secondary | ICD-10-CM | POA: Diagnosis not present

## 2020-07-13 DIAGNOSIS — S0990XA Unspecified injury of head, initial encounter: Secondary | ICD-10-CM | POA: Diagnosis present

## 2020-07-13 DIAGNOSIS — Z043 Encounter for examination and observation following other accident: Secondary | ICD-10-CM | POA: Diagnosis not present

## 2020-07-13 DIAGNOSIS — R519 Headache, unspecified: Secondary | ICD-10-CM | POA: Diagnosis not present

## 2020-07-13 DIAGNOSIS — Y9289 Other specified places as the place of occurrence of the external cause: Secondary | ICD-10-CM | POA: Insufficient documentation

## 2020-07-13 DIAGNOSIS — Y998 Other external cause status: Secondary | ICD-10-CM | POA: Insufficient documentation

## 2020-07-13 HISTORY — DX: Other corneal scars and opacities: H17.89

## 2020-07-13 LAB — CBC WITH DIFFERENTIAL/PLATELET
Abs Immature Granulocytes: 0.03 10*3/uL (ref 0.00–0.07)
Basophils Absolute: 0 10*3/uL (ref 0.0–0.1)
Basophils Relative: 0 %
Eosinophils Absolute: 0.3 10*3/uL (ref 0.0–0.5)
Eosinophils Relative: 4 %
HCT: 37.1 % — ABNORMAL LOW (ref 39.0–52.0)
Hemoglobin: 12.3 g/dL — ABNORMAL LOW (ref 13.0–17.0)
Immature Granulocytes: 0 %
Lymphocytes Relative: 33 %
Lymphs Abs: 2.7 10*3/uL (ref 0.7–4.0)
MCH: 29.7 pg (ref 26.0–34.0)
MCHC: 33.2 g/dL (ref 30.0–36.0)
MCV: 89.6 fL (ref 80.0–100.0)
Monocytes Absolute: 0.7 10*3/uL (ref 0.1–1.0)
Monocytes Relative: 9 %
Neutro Abs: 4.3 10*3/uL (ref 1.7–7.7)
Neutrophils Relative %: 54 %
Platelets: 270 10*3/uL (ref 150–400)
RBC: 4.14 MIL/uL — ABNORMAL LOW (ref 4.22–5.81)
RDW: 14.9 % (ref 11.5–15.5)
WBC: 8.1 10*3/uL (ref 4.0–10.5)
nRBC: 0 % (ref 0.0–0.2)

## 2020-07-13 LAB — COMPREHENSIVE METABOLIC PANEL
ALT: 18 U/L (ref 0–44)
AST: 24 U/L (ref 15–41)
Albumin: 4.1 g/dL (ref 3.5–5.0)
Alkaline Phosphatase: 77 U/L (ref 38–126)
Anion gap: 7 (ref 5–15)
BUN: 19 mg/dL (ref 6–20)
CO2: 27 mmol/L (ref 22–32)
Calcium: 8.5 mg/dL — ABNORMAL LOW (ref 8.9–10.3)
Chloride: 102 mmol/L (ref 98–111)
Creatinine, Ser: 0.93 mg/dL (ref 0.61–1.24)
GFR calc Af Amer: >60 mL/min (ref >60)
GFR calc non Af Amer: >60 mL/min (ref >60)
Glucose, Bld: 83 mg/dL (ref 70–99)
Potassium: 3.6 mmol/L (ref 3.5–5.1)
Sodium: 136 mmol/L (ref 135–145)
Total Bilirubin: 0.4 mg/dL (ref 0.3–1.2)
Total Protein: 7.7 g/dL (ref 6.5–8.1)

## 2020-07-13 MED ORDER — IOHEXOL 300 MG/ML  SOLN
100.0000 mL | Freq: Once | INTRAMUSCULAR | Status: AC | PRN
  Administered 2020-07-13: 100 mL via INTRAVENOUS

## 2020-07-13 MED ORDER — SODIUM CHLORIDE 0.9 % IV BOLUS: 1000.0000 mL | Freq: Once | INTRAVENOUS | Status: DC

## 2020-07-13 MED ORDER — SODIUM CHLORIDE 0.9 % IV BOLUS
1000.0000 mL | Freq: Once | INTRAVENOUS | Status: AC
  Administered 2020-07-13: 1000 mL via INTRAVENOUS
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
## 2020-07-13 NOTE — ED Notes (Addendum)
Entered pts. Room. Pt. Left facility. Looked in trash can and at bedside for the pts. IV. Notified the charge nurse Verlon Au) That pt. Left AMA with an IV in. Attempted to call Pt. To have them return so that the IV can be removed. Pt. Did not answer.
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
## 2020-07-13 NOTE — ED Notes (Signed)
Pt decide to leave  

## 2020-10-17 ENCOUNTER — Other Ambulatory Visit: Payer: Self-pay

## 2020-10-17 ENCOUNTER — Emergency Department (HOSPITAL_COMMUNITY)
Admission: EM | Admit: 2020-10-17 | Discharge: 2020-10-17 | Disposition: A | Discharge status: Home / Self Care | Payer: Self-pay | Attending: Emergency Medicine | Admitting: Emergency Medicine

## 2020-10-17 ENCOUNTER — Emergency Department (HOSPITAL_COMMUNITY): Payer: Self-pay

## 2020-10-17 ENCOUNTER — Encounter (HOSPITAL_COMMUNITY): Payer: Self-pay | Admitting: *Deleted

## 2020-10-17 DIAGNOSIS — F17210 Nicotine dependence, cigarettes, uncomplicated: Secondary | ICD-10-CM | POA: Insufficient documentation

## 2020-10-17 DIAGNOSIS — J020 Streptococcal pharyngitis: Secondary | ICD-10-CM | POA: Insufficient documentation

## 2020-10-17 DIAGNOSIS — G5632 Lesion of radial nerve, left upper limb: Secondary | ICD-10-CM | POA: Insufficient documentation

## 2020-10-17 LAB — GROUP A STREP BY PCR: Group A Strep by PCR: DETECTED — AB

## 2020-10-17 MED ORDER — PREDNISONE 10 MG (21) PO TBPK: ORAL_TABLET | Freq: Every day | ORAL | 0 refills | Status: AC

## 2020-10-17 MED ORDER — PREDNISONE 50 MG PO TABS
60.0000 mg | ORAL_TABLET | Freq: Once | ORAL | Status: AC
  Administered 2020-10-17: 60 mg via ORAL
  Filled 2020-10-17: qty 1

## 2020-10-17 MED ORDER — IBUPROFEN 400 MG PO TABS
600.0000 mg | ORAL_TABLET | Freq: Once | ORAL | Status: AC
  Administered 2020-10-17: 600 mg via ORAL
  Filled 2020-10-17: qty 2

## 2020-10-17 MED ORDER — PENICILLIN G BENZATHINE 1200000 UNIT/2ML IM SUSP
1.2000 10*6.[IU] | Freq: Once | INTRAMUSCULAR | Status: AC
  Administered 2020-10-17: 1.2 10*6.[IU] via INTRAMUSCULAR
  Filled 2020-10-17: qty 2

## 2020-10-17 NOTE — Discharge Instructions (Addendum)
Strep test was positive. You were treated with a shot of penicillin. No further antibiotics are needed.  Prescription for steroids were sent to your pharmacy.  Start taking them tomorrow.  You already received a dose in the emergency department.  This should help with pain and inflammation and also with your radial nerve palsy.   Wear the splint at all times until you follow-up with neurology.  I given you the information for the on-call neurologist Dr. Gerilyn Pilgrim.  Call his office to schedule the next available appointment.  You can also take Tylenol and ibuprofen to help with your pain.  Ibuprofen will help with swelling as well.

## 2020-10-17 NOTE — ED Triage Notes (Signed)
Pt with left hand/ forearm numbness since Sunday.  Swelling to left hand as well.  Pt denies any injury.  Denies numbness anywhere else.  Sore throat and fever since Wednesday.  Covid test(at home test) on Saturday and was negative.  Went back to work on Sunday.
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
## 2020-10-17 NOTE — ED Notes (Signed)
No adverse reaction to IM injection 

## 2020-10-17 NOTE — Clinical Social Work Note (Signed)
Transition of Care [facility]) - Emergency Department Mini Assessment  Patient Details  Name: Taylor Liu MRN: [id number] Date of Birth: [birth year]  [facility]) CM/SW Contact:    Ewing Schlein, LCSW Phone Number: 10/17/2020, 1:12 PM  Clinical Narrative: Patient is a 34 year old male who presented to the ED for numbness and sore throat. Per chart review, patient does not have a PCP or insurance. CSW spoke with patient regarding referral to [facility] for PCP assistance. Patient agreeable to referral. CSW made referral to [facility]. TOC signing off.  ED Mini Assessment: What brought you to the Emergency Department? : Numbness, sore throat Barriers to Discharge: Inadequate or no insurance, ED PCP establishment, ED Barriers Resolved Barrier interventions: Referred to [facility] Means of departure: Car Interventions which prevented an admission or readmission: Other (must enter comment) (Referral to [facility])  Patient Contact and Communications Key Contact 1: [facility] Contact Date: 10/17/20 Contact time: 1308 Contact Phone Number: [phone] Call outcome: Referral made for PCP assistance Patient states their goals for this hospitalization and ongoing recovery are:: Discharge home Choice offered to / list presented to : NA  Admission diagnosis:  Numbness; Sore Throat There are no problems to display for this patient.  PCP:  Patient, No Pcp Per Pharmacy:   [facility] [address], [place] - [address] [place] [address] Phone: [phone] Fax: [phone]

## 2020-10-17 NOTE — ED Provider Notes (Addendum)
[facility] EMERGENCY DEPARTMENT Provider Note   CSN: [id number] Arrival date & time: 10/17/20  1041     History Chief Complaint  Patient presents with  . Numbness    Taylor Liu is a 34 y.o. male with medical history significant for leukoma of both eyes. He has a history of substance abuse, currently on methadone. Not anticoagulated.   HPI Patient presents to emergency department today with chief complaint of left forearm and hand numbness x4 days.  Patient states his first noticed the symptoms after waking up.  He was at work and has had down to take a break.  He states he had his left hand either resting on his left or the table, he is unsure if he fell asleep with his head on his hand.  He states he is unable to move his wrist around without conscious effort.  He has decreased sensation in his left arm and weak grip strength he feels.  He has not taken any medication for symptoms prior to arrival. Patient also tells me he was standing up at work x 3 days ago and hit his right temple area on the corner of a cabinet. He did not fall to the floor, no LOC. He had a slight headache immediately after that resolved without intervention.   Patient also states that he has had a sore throat x7 days.  He has a history of frequent strep throat and peritonsillar abscess.  He describes his sore throat as feeling like an aching and raw sensation.  He had intermittent fevers and generalized weakness when sore throat first started. Last measured fever was x 3 days ago.  He states his sore throat has improved however is still bothering him.  He is tolerating p.o. intake without any difficulty. He denies headache, visual changes, congestion, otalgia, cough, palpitations, syncope, chest pain, shortness of breath, back pain, lower extremity numbness or weakness, lightheadedness, dizziness. He took a home covid test and it was negative x 5 days ago. Denies history of stroke.      Past Medical History:    Diagnosis Date  . Leukoma of both eyes     There are no problems to display for this patient.   History reviewed. No pertinent surgical history.     History reviewed. No pertinent family history.  Social History   Tobacco Use  . Smoking status: Current Every Day Smoker    Packs/day: 1.00    Types: Cigarettes  . Smokeless tobacco: Never Used  Substance Use Topics  . Alcohol use: Never  . Drug use: Not Currently    Home Medications Prior to Admission medications   Medication Sig Start Date End Date Taking? Authorizing Provider  citalopram (CELEXA) 20 MG tablet Take 20 mg by mouth every morning. 06/08/20   [provider]  methadone (METHADOSE) 40 MG disintegrating tablet Take 80 mg by mouth daily.    [provider]  predniSONE (STERAPRED UNI-PAK 21 TAB) 10 MG (21) TBPK tablet Take by mouth daily. Take 6 tabs by mouth daily  for 2 days, then 5 tabs for 2 days, then 4 tabs for 2 days, then 3 tabs for 2 days, 2 tabs for 2 days, then 1 tab by mouth daily for 2 days 10/18/20   Shanon AceWalisiewicz, Floyed Masoud E, PA-C    Allergies    Sulfa antibiotics  Review of Systems   Review of Systems All other systems are reviewed and are negative for acute change except as noted in the HPI.  Physical Exam Updated Vital Signs BP 109/76 (BP Location: Right Arm)   Pulse 67   Temp 98.7 F (37.1 C) (Temporal)   Resp 18   Ht 5\' 5"  (1.651 m)   Wt 65.8 kg   SpO2 97%   BMI 24.13 kg/m   Physical Exam Vitals and nursing note reviewed.  Constitutional:      General: He is not in acute distress.    Appearance: He is not ill-appearing.     Comments: Airway patent.  HENT:     Head: Normocephalic and atraumatic.     Comments: No tenderness to palpation of skull. No deformities or crepitus noted. No open wounds, abrasions or lacerations.    Right Ear: Tympanic membrane and external ear normal.     Left Ear: Tympanic membrane and external ear normal.     Nose: Nose normal.      Mouth/Throat:     Mouth: Mucous membranes are moist.     Pharynx: Oropharynx is clear.     Comments: Minor erythema to oropharynx, no edema, no exudate, 2+ bilateral tonsillar swelling, voice normal, neck supple without lymphadenopathy. Uvula midline without swelling.  Eyes:     General: No scleral icterus.       Right eye: No discharge.        Left eye: No discharge.     Extraocular Movements: Extraocular movements intact.     Conjunctiva/sclera: Conjunctivae normal.     Pupils: Pupils are equal, round, and reactive to light.  Neck:     Vascular: No JVD.     Comments: Full ROM intact without spinous process TTP. No bony stepoffs or deformities, no paraspinous muscle TTP or muscle spasms. No rigidity or meningeal signs. No bruising, erythema, or swelling.  Cardiovascular:     Rate and Rhythm: Normal rate and regular rhythm.     Pulses: Normal pulses.          Radial pulses are 2+ on the right side and 2+ on the left side.     Heart sounds: Normal heart sounds.  Pulmonary:     Comments: Lungs clear to auscultation in all fields. Symmetric chest rise. No wheezing, rales, or rhonchi. Abdominal:     Comments: Abdomen is soft, non-distended, and non-tender in all quadrants. No rigidity, no guarding. No peritoneal signs.  Musculoskeletal:     Cervical back: Normal range of motion.     Comments: No deformity of left upper extremity. Left hand without anatomic snuffbox tenderness.   Full ROM of left shoulder and elbow. Compartments soft in left upper extremity.   Skin:    General: Skin is warm and dry.     Capillary Refill: Capillary refill takes less than 2 seconds.  Neurological:     Mental Status: He is oriented to person, place, and time.     GCS: GCS eye subscore is 4. GCS verbal subscore is 5. GCS motor subscore is 6.     Comments: Speech is clear and goal oriented, follows commands CN III-XII intact, no facial droop Decreased sensation in left upper extremity compared to right  with sharp and dull touch. Grip strength on left upper extremity 4/5 compared to 5/5 on right upper extremity.  Normal strength in lower extremities bilaterally including dorsiflexion and plantar flexion, strong and equal grip strength Moves extremities without ataxia. Normal gait and balance   Psychiatric:        Behavior: Behavior normal.     ED Results / Procedures / Treatments  Labs (all labs ordered are listed, but only abnormal results are displayed) Labs Reviewed  GROUP A STREP BY PCR - Abnormal; Notable for the following components:      Result Value   Group A Strep by PCR DETECTED (*)    All other components within normal limits    EKG None  Radiology DG Wrist Complete Left  Result Date: 10/17/2020 CLINICAL DATA:  Pain and numbness. EXAM: LEFT WRIST - COMPLETE 3+ VIEW COMPARISON:  None. FINDINGS: There is no evidence of fracture or dislocation. There is no evidence of arthropathy or other focal bone abnormality. Soft tissues are unremarkable. IMPRESSION: Negative. Electronically Signed   By: Marlan Palau M.D.   On: 10/17/2020 14:24    Procedures Procedures (including critical care time)  Medications Ordered in ED Medications  penicillin g benzathine (BICILLIN LA) 1200000 UNIT/2ML injection 1.2 Million Units (has no administration in time range)  ibuprofen (ADVIL) tablet 600 mg (600 mg Oral Given 10/17/20 1340)  predniSONE (DELTASONE) tablet 60 mg (60 mg Oral Given 10/17/20 1340)    ED Course  I have reviewed the triage vital signs and the nursing notes.  Pertinent labs & imaging results that were available during my care of the patient were reviewed by me and considered in my medical decision making (see chart for details).    MDM Rules/Calculators/A&P                          History provided by patient with additional history obtained from chart review.    34 year old male presenting with sore throat and left arm numbness.  He is afebrile,  hemodynamically stable.  Well-appearing, in no acute distress.  On exam he has erythema and bilateral tonsillar swelling, no signs of a PTA, no exudates on tonsils, no swelling of uvula airway is intact.  He is handling secretions without difficulty.  He has decreased sensation of his left arm after falling asleep on it 4 days ago.  He has full range of motion of left shoulder and elbow.  Decreased range of motion of his wrist.  Grip strength slightly decreased on the left compared to the right and upper extremities.  He does have brisk cap refill.  Radial pulse 2+ bilaterally.  Equal tactile temperature to all extremities.  Exam is otherwise normal. Left upper extremity is neurovascularly intact.  Presentation not consistent with stroke. Strep test Positive. Pt treated with IM penicillin and steroids while in the ED. Pt does not appear dehydrated, but did discuss importance of water rehydration.  Encouraged at home supportive care measures. Patient successfully fluid challenged in the ED without difficulty swallowing. Left extremity numbness consistent with radial nerve palsy.  Xray of left wrist viewed by me does not show any sign of underlying fracture or dislocation. No anatomic snuffbox tenderness. Velcro wrist brace applied. He is neurovascularly intact  After splint application. Prescription sent to pharmacy for prednisone taper. Patient will need to f/u outpatient with neurology.  The patient appears reasonably screened and/or stabilized for discharge and I doubt any other medical condition or other [facility] requiring further screening, evaluation, or treatment in the ED at this time prior to discharge. The patient is safe for discharge with strict return precautions discussed.    Portions of this note were generated with Scientist, clinical (histocompatibility and immunogenetics). Dictation errors may occur despite best attempts at proofreading.    Final Clinical Impression(s) / ED Diagnoses Final diagnoses:  Radial nerve palsy,  left  Strep pharyngitis    Rx / DC Orders ED Discharge Orders         Ordered    predniSONE (STERAPRED UNI-PAK 21 TAB) 10 MG (21) TBPK tablet  Daily        10/17/20 1339           Shanon Ace, PA-C 10/17/20 1443    Shanon Ace, PA-C 10/17/20 1444    Bethann Berkshire, MD 10/18/20 [phone]

## 2021-10-27 ENCOUNTER — Other Ambulatory Visit: Payer: Self-pay

## 2021-10-27 ENCOUNTER — Emergency Department (HOSPITAL_COMMUNITY): Payer: Self-pay

## 2021-10-27 ENCOUNTER — Encounter (HOSPITAL_COMMUNITY): Payer: Self-pay | Admitting: *Deleted

## 2021-10-27 ENCOUNTER — Emergency Department (HOSPITAL_COMMUNITY)
Admission: EM | Admit: 2021-10-27 | Discharge: 2021-10-27 | Disposition: A | Discharge status: Home / Self Care | Payer: Self-pay | Attending: Emergency Medicine | Admitting: Emergency Medicine

## 2021-10-27 DIAGNOSIS — W228XXA Striking against or struck by other objects, initial encounter: Secondary | ICD-10-CM | POA: Insufficient documentation

## 2021-10-27 DIAGNOSIS — F17210 Nicotine dependence, cigarettes, uncomplicated: Secondary | ICD-10-CM | POA: Insufficient documentation

## 2021-10-27 DIAGNOSIS — S0181XA Laceration without foreign body of other part of head, initial encounter: Secondary | ICD-10-CM

## 2021-10-27 DIAGNOSIS — S02401A Maxillary fracture, unspecified, initial encounter for closed fracture: Secondary | ICD-10-CM | POA: Insufficient documentation

## 2021-10-27 DIAGNOSIS — S01111A Laceration without foreign body of right eyelid and periocular area, initial encounter: Secondary | ICD-10-CM | POA: Insufficient documentation

## 2021-10-27 DIAGNOSIS — S022XXA Fracture of nasal bones, initial encounter for closed fracture: Secondary | ICD-10-CM | POA: Insufficient documentation

## 2021-10-27 MED ORDER — IBUPROFEN 200 MG PO TABS: 600.0000 mg | ORAL_TABLET | Freq: Three times a day (TID) | ORAL | 0 refills | Status: AC | PRN
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
## 2022-03-02 ENCOUNTER — Emergency Department (HOSPITAL_COMMUNITY): Payer: Self-pay

## 2022-03-02 ENCOUNTER — Emergency Department (HOSPITAL_COMMUNITY)
Admission: EM | Admit: 2022-03-02 | Discharge: 2022-03-02 | Disposition: A | Discharge status: Home / Self Care | Payer: Self-pay | Attending: Emergency Medicine | Admitting: Emergency Medicine

## 2022-03-02 ENCOUNTER — Encounter (HOSPITAL_COMMUNITY): Payer: Self-pay | Admitting: Emergency Medicine

## 2022-03-02 DIAGNOSIS — Y92410 Unspecified street and highway as the place of occurrence of the external cause: Secondary | ICD-10-CM | POA: Diagnosis not present

## 2022-03-02 DIAGNOSIS — S299XXA Unspecified injury of thorax, initial encounter: Secondary | ICD-10-CM | POA: Diagnosis not present

## 2022-03-02 DIAGNOSIS — S3991XA Unspecified injury of abdomen, initial encounter: Secondary | ICD-10-CM | POA: Insufficient documentation

## 2022-03-02 DIAGNOSIS — S0101XA Laceration without foreign body of scalp, initial encounter: Secondary | ICD-10-CM | POA: Diagnosis not present

## 2022-03-02 DIAGNOSIS — S40819A Abrasion of unspecified upper arm, initial encounter: Secondary | ICD-10-CM | POA: Diagnosis not present

## 2022-03-02 DIAGNOSIS — S0181XA Laceration without foreign body of other part of head, initial encounter: Secondary | ICD-10-CM | POA: Insufficient documentation

## 2022-03-02 DIAGNOSIS — S30810A Abrasion of lower back and pelvis, initial encounter: Secondary | ICD-10-CM | POA: Diagnosis not present

## 2022-03-02 DIAGNOSIS — T07XXXA Unspecified multiple injuries, initial encounter: Secondary | ICD-10-CM

## 2022-03-02 DIAGNOSIS — S0990XA Unspecified injury of head, initial encounter: Secondary | ICD-10-CM | POA: Diagnosis present

## 2022-03-02 LAB — COMPREHENSIVE METABOLIC PANEL
ALT: 21 U/L (ref 0–44)
AST: 35 U/L (ref 15–41)
Albumin: 4.6 g/dL (ref 3.5–5.0)
Alkaline Phosphatase: 68 U/L (ref 38–126)
Anion gap: 10 (ref 5–15)
BUN: 16 mg/dL (ref 6–20)
CO2: 27 mmol/L (ref 22–32)
Calcium: 9.3 mg/dL (ref 8.9–10.3)
Chloride: 102 mmol/L (ref 98–111)
Creatinine, Ser: 0.75 mg/dL (ref 0.61–1.24)
GFR, Estimated: >60 mL/min (ref >60)
Glucose, Bld: 89 mg/dL (ref 70–99)
Potassium: 3.3 mmol/L — ABNORMAL LOW (ref 3.5–5.1)
Sodium: 139 mmol/L (ref 135–145)
Total Bilirubin: 0.4 mg/dL (ref 0.3–1.2)
Total Protein: 8.3 g/dL — ABNORMAL HIGH (ref 6.5–8.1)

## 2022-03-02 LAB — CBC WITH DIFFERENTIAL/PLATELET
Abs Immature Granulocytes: 0.17 10*3/uL — ABNORMAL HIGH (ref 0.00–0.07)
Basophils Absolute: 0.1 10*3/uL (ref 0.0–0.1)
Basophils Relative: 0 %
Eosinophils Absolute: 0.1 10*3/uL (ref 0.0–0.5)
Eosinophils Relative: 1 %
HCT: 42.8 % (ref 39.0–52.0)
Hemoglobin: 13.8 g/dL (ref 13.0–17.0)
Immature Granulocytes: 2 %
Lymphocytes Relative: 20 %
Lymphs Abs: 2.3 10*3/uL (ref 0.7–4.0)
MCH: 28.3 pg (ref 26.0–34.0)
MCHC: 32.2 g/dL (ref 30.0–36.0)
MCV: 87.7 fL (ref 80.0–100.0)
Monocytes Absolute: 0.8 10*3/uL (ref 0.1–1.0)
Monocytes Relative: 7 %
Neutro Abs: 8.3 10*3/uL — ABNORMAL HIGH (ref 1.7–7.7)
Neutrophils Relative %: 70 %
Platelets: 338 10*3/uL (ref 150–400)
RBC: 4.88 MIL/uL (ref 4.22–5.81)
RDW: 13.8 % (ref 11.5–15.5)
WBC: 11.5 10*3/uL — ABNORMAL HIGH (ref 4.0–10.5)
nRBC: 0 % (ref 0.0–0.2)

## 2022-03-02 MED ORDER — IOHEXOL 300 MG/ML  SOLN
100.0000 mL | Freq: Once | INTRAMUSCULAR | Status: AC | PRN
  Administered 2022-03-02: 100 mL via INTRAVENOUS
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
## 2022-03-02 NOTE — ED Notes (Signed)
Ambulatory to lobby 
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
## 2022-10-30 DEATH — deceased
